# Patient Record
Sex: Male | Born: 1965 | Hispanic: No | Marital: Single | Smoking: Never smoker
Health system: Southern US, Community
[De-identification: ages and names within clinical notes are randomized; demographics above are authoritative.]

---

## 2000-10-16 ENCOUNTER — Encounter: Payer: Self-pay | Admitting: Emergency Medicine

## 2000-10-16 ENCOUNTER — Emergency Department (HOSPITAL_COMMUNITY): Admission: EM | Admit: 2000-10-16 | Discharge: 2000-10-16 | Payer: Self-pay | Admitting: *Deleted

## 2000-11-09 ENCOUNTER — Encounter: Payer: Self-pay | Admitting: Emergency Medicine

## 2000-11-09 ENCOUNTER — Emergency Department (HOSPITAL_COMMUNITY): Admission: EM | Admit: 2000-11-09 | Discharge: 2000-11-09 | Payer: Self-pay | Admitting: Emergency Medicine

## 2000-11-22 ENCOUNTER — Emergency Department (HOSPITAL_COMMUNITY): Admission: EM | Admit: 2000-11-22 | Discharge: 2000-11-22 | Payer: Self-pay

## 2001-09-25 ENCOUNTER — Encounter: Payer: Self-pay | Admitting: Emergency Medicine

## 2001-09-25 ENCOUNTER — Emergency Department (HOSPITAL_COMMUNITY): Admission: EM | Admit: 2001-09-25 | Discharge: 2001-09-25 | Payer: Self-pay | Admitting: Emergency Medicine

## 2001-12-31 ENCOUNTER — Emergency Department (HOSPITAL_COMMUNITY): Admission: EM | Admit: 2001-12-31 | Discharge: 2001-12-31 | Payer: Self-pay | Admitting: *Deleted

## 2002-03-05 ENCOUNTER — Emergency Department (HOSPITAL_COMMUNITY): Admission: EM | Admit: 2002-03-05 | Discharge: 2002-03-05 | Payer: Self-pay | Admitting: Physical Therapy

## 2002-03-05 ENCOUNTER — Encounter: Payer: Self-pay | Admitting: Emergency Medicine

## 2003-05-12 ENCOUNTER — Emergency Department (HOSPITAL_COMMUNITY): Admission: EM | Admit: 2003-05-12 | Discharge: 2003-05-12 | Payer: Self-pay | Admitting: Emergency Medicine
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
## 2016-02-08 ENCOUNTER — Emergency Department (HOSPITAL_COMMUNITY)
Admission: EM | Admit: 2016-02-08 | Discharge: 2016-02-08 | Disposition: A | Discharge status: Home / Self Care | Payer: Self-pay | Attending: Emergency Medicine | Admitting: Emergency Medicine

## 2016-02-08 ENCOUNTER — Encounter (HOSPITAL_COMMUNITY): Payer: Self-pay | Admitting: Emergency Medicine

## 2016-02-08 DIAGNOSIS — S0501XA Injury of conjunctiva and corneal abrasion without foreign body, right eye, initial encounter: Secondary | ICD-10-CM | POA: Insufficient documentation

## 2016-02-08 DIAGNOSIS — Y9389 Activity, other specified: Secondary | ICD-10-CM | POA: Insufficient documentation

## 2016-02-08 DIAGNOSIS — Y92009 Unspecified place in unspecified non-institutional (private) residence as the place of occurrence of the external cause: Secondary | ICD-10-CM | POA: Insufficient documentation

## 2016-02-08 DIAGNOSIS — Y999 Unspecified external cause status: Secondary | ICD-10-CM | POA: Insufficient documentation

## 2016-02-08 DIAGNOSIS — X58XXXA Exposure to other specified factors, initial encounter: Secondary | ICD-10-CM | POA: Insufficient documentation

## 2016-02-08 MED ORDER — FLUORESCEIN SODIUM 1 MG OP STRP
1.0000 | ORAL_STRIP | Freq: Once | OPHTHALMIC | Status: AC
  Administered 2016-02-08: 1 via OPHTHALMIC
  Filled 2016-02-08: qty 1

## 2016-02-08 MED ORDER — TOBRAMYCIN 0.3 % OP SOLN
2.0000 [drp] | OPHTHALMIC | Status: DC
  Administered 2016-02-08: 2 [drp] via OPHTHALMIC
  Filled 2016-02-08: qty 5

## 2016-02-08 MED ORDER — HYDROCODONE-ACETAMINOPHEN 5-325 MG PO TABS
1.0000 | ORAL_TABLET | Freq: Once | ORAL | Status: AC
  Administered 2016-02-08: 1 via ORAL
  Filled 2016-02-08: qty 1

## 2016-02-08 NOTE — ED Triage Notes (Signed)
Pt sts right eye pain after possibly getting some wood in his eye at work yesterday

## 2016-02-08 NOTE — ED Provider Notes (Signed)
  [facility]-EMERGENCY DEPT Provider Note   CSN: [id number] Arrival date & time: 02/08/16  1846     History   Chief Complaint Chief Complaint  Patient presents with  . Eye Pain    HPI Ralph Collins is a 50 y.o. male. He complains of right eye pain. He was working on his house yesterday. He was working with wood and power tools. He felt something was in his eye. He rinsed it out. This morning he awakened his eye was painful and swollen and he presents here. His vision is normal. Right eye symptoms only.  HPI  History reviewed. No pertinent past medical history.  There are no active problems to display for this patient.   History reviewed. No pertinent surgical history.     Home Medications    Prior to Admission medications   Not on File    Family History History reviewed. No pertinent family history.  Social History Social History  Substance Use Topics  . Smoking status: Never Smoker  . Smokeless tobacco: Never Used  . Alcohol use No     Allergies   Patient has no known allergies.   Review of Systems Review of Systems  Constitutional: Negative for appetite change, chills, diaphoresis, fatigue and fever.  HENT: Negative for mouth sores, sore throat and trouble swallowing.   Eyes: Positive for photophobia, pain and redness.  Respiratory: Negative for cough, chest tightness, shortness of breath and wheezing.   Cardiovascular: Negative for chest pain.  Gastrointestinal: Negative for abdominal distention, abdominal pain, diarrhea, nausea and vomiting.  Endocrine: Negative for polydipsia, polyphagia and polyuria.  Genitourinary: Negative for dysuria, frequency and hematuria.  Musculoskeletal: Negative for gait problem.  Skin: Negative for color change, pallor and rash.  Neurological: Negative for dizziness, syncope, light-headedness and headaches.  Hematological: Does not bruise/bleed easily.  Psychiatric/Behavioral: Negative for behavioral problems and  confusion.     Physical Exam Updated Vital Signs BP 162/94 (BP Location: Left Arm)   Pulse (!) 55   Temp 98 F (36.7 C) (Oral)   Resp 18   SpO2 99%   Physical Exam  Eyes:       ED Treatments / Results  Labs (all labs ordered are listed, but only abnormal results are displayed) Labs Reviewed - No data to display  EKG  EKG Interpretation None       Radiology No results found.  Procedures Procedures (including critical care time)  Medications Ordered in ED Medications  fluorescein ophthalmic strip 1 strip (not administered)  tobramycin (TOBREX) 0.3 % ophthalmic solution 2 drop (not administered)  HYDROcodone-acetaminophen (NORCO/VICODIN) 5-325 MG per tablet 1 tablet (not administered)     Initial Impression / Assessment and Plan / ED Course  I have reviewed the triage vital signs and the nursing notes.  Pertinent labs & imaging results that were available during my care of the patient were reviewed by me and considered in my medical decision making (see chart for details).  Clinical Course     Eye patch placed. Given Tobra mycin drops. Vicodin for pain. Discharge home. Follow-up with ophthalmology 48 hours with continued symptoms  Final Clinical Impressions(s) / ED Diagnoses   Final diagnoses:  Abrasion of right cornea, initial encounter    New Prescriptions New Prescriptions   No medications on file     Rolland PorterMark Gracee Ratterree, MD 02/08/16 Ernest[facility] [id number]

## 2016-02-08 NOTE — Discharge Instructions (Signed)
Place drops every 4 hours while awake. Keep patch in place to help prevent pain. If pain persists for more than 2 days, call Dr. Alben SpittleWeaver, eye doctor, to make appointment.

## 2019-09-13 ENCOUNTER — Ambulatory Visit: Payer: Self-pay | Attending: Internal Medicine

## 2019-09-13 DIAGNOSIS — Z23 Encounter for immunization: Secondary | ICD-10-CM

## 2019-09-13 NOTE — Progress Notes (Signed)
   Covid-[facility] Vaccination [facility]  Name:  Ralph Collins    MRN: [id number] DOB: 1965/09/28  09/13/2019  [name] was observed post Covid-19 immunization for 15 minutes without incident. He was provided with Vaccine Information Sheet and instruction to access the V-Safe system.   [name] was instructed to call 911 with any severe reactions post vaccine: Marland Kitchen Difficulty breathing  . Swelling of face and throat  . A fast heartbeat  . A bad rash all over body  . Dizziness and weakness   Immunizations Administered    Name Date Dose VIS Date Route   Pfizer COVID-19 Vaccine 09/13/2019 10:04 AM 0.3 mL 05/28/2018 Intramuscular   Manufacturer: [employer], [employer]   Lot: [id number]   NDC: [address]-7
